# Patient Record
Sex: Male | Born: 2000 | Race: White | Hispanic: No | Marital: Single | Smoking: Never smoker
Health system: Southern US, Community
[De-identification: ages and names within clinical notes are randomized; demographics above are authoritative.]

## PROBLEM LIST (undated history)

## (undated) DIAGNOSIS — F988 Other specified behavioral and emotional disorders with onset usually occurring in childhood and adolescence: Secondary | ICD-10-CM

## (undated) HISTORY — PX: WISDOM TOOTH EXTRACTION: SHX21

---
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
## 2021-02-06 DIAGNOSIS — U071 COVID-19: Secondary | ICD-10-CM

## 2021-02-06 HISTORY — DX: COVID-19: U07.1

## 2021-02-08 ENCOUNTER — Other Ambulatory Visit: Payer: Self-pay

## 2021-02-08 ENCOUNTER — Emergency Department (INDEPENDENT_AMBULATORY_CARE_PROVIDER_SITE_OTHER)
Admission: RE | Admit: 2021-02-08 | Discharge: 2021-02-08 | Disposition: A | Discharge status: Home / Self Care | Payer: 59 | Source: Ambulatory Visit

## 2021-02-08 VITALS — BP 123/78 | HR 84 | Temp 98.7°F | Resp 16 | Ht 70.0 in | Wt 186.0 lb

## 2021-02-08 DIAGNOSIS — U071 COVID-19: Secondary | ICD-10-CM

## 2021-02-08 NOTE — ED Provider Notes (Signed)
[facility]    CSN: [id number] Arrival date & time: 02/08/21  1402      History   Chief Complaint Chief Complaint  Patient presents with   Covid Positive    HPI Mark Carey is a 20 y.o. male.   HPI 20 year old male presents with positive COVID-19 as of Thursday, 02/06/2021.  Patient works night shifts and needs a note to be excused from work.  Past Medical History:  Diagnosis Date   ADD (attention deficit disorder)    Asthma    COVID-19 02/06/2021    There are no problems to display for this patient.   Past Surgical History:  Procedure Laterality Date   WISDOM TOOTH EXTRACTION Bilateral        Home Medications    Prior to Admission medications   Medication Sig Start Date End Date Taking? Authorizing Provider  dexmethylphenidate (FOCALIN) 10 MG tablet Take 30 mg by mouth 2 (two) times daily. Patient not taking: Reported on 02/08/2021    [provider]  ibuprofen (ADVIL,MOTRIN) 100 MG/5ML suspension Take 15 mg/kg by mouth every 6 (six) hours as needed. Patient was given this medication for headache. Patient not taking: Reported on 02/08/2021    [provider]    Family History Family History  Problem Relation Age of Onset   Healthy Mother    Healthy Father     Social History Social History   Tobacco Use   Smoking status: Never    Passive exposure: Never   Smokeless tobacco: Never  Vaping Use   Vaping Use: Never used  Substance Use Topics   Alcohol use: Not Currently   Drug use: Not Currently     Allergies   Promethazine hcl   Review of Systems Review of Systems  Constitutional:  Positive for fatigue and fever.  All other systems reviewed and are negative.   Physical Exam Triage Vital Signs ED Triage Vitals  Enc Vitals Group     BP 02/08/21 1417 123/78     Pulse Rate 02/08/21 1417 84     Resp 02/08/21 1417 16     Temp 02/08/21 1417 98.7 F (37.1 C)     Temp Source 02/08/21 1417 Oral     SpO2  02/08/21 1417 99 %     Weight 02/08/21 1419 186 lb (84.4 kg)     Height 02/08/21 1419 5\' 10"  (1.778 m)     Head Circumference --      Peak Flow --      Pain Score 02/08/21 1419 0     Pain Loc --      Pain Edu? --      Excl. in GC? --    No data found.  Updated Vital Signs BP 123/78 (BP Location: Left Arm)   Pulse 84   Temp 98.7 F (37.1 C) (Oral)   Resp 16   Ht 5\' 10"  (1.778 m)   Wt 186 lb (84.4 kg)   SpO2 99%   BMI 26.69 kg/m      Physical Exam Vitals and nursing note reviewed.  Constitutional:      General: He is not in acute distress.    Appearance: Normal appearance. He is normal weight. He is not ill-appearing.  HENT:     Head: Normocephalic and atraumatic.     Right Ear: Tympanic membrane, ear canal and external ear normal.     Left Ear: Tympanic membrane, ear canal and external ear normal.     Mouth/Throat:  Mouth: Mucous membranes are moist.     Pharynx: Oropharynx is clear.  Eyes:     Extraocular Movements: Extraocular movements intact.     Conjunctiva/sclera: Conjunctivae normal.     Pupils: Pupils are equal, round, and reactive to light.  Cardiovascular:     Rate and Rhythm: Normal rate and regular rhythm.     Pulses: Normal pulses.     Heart sounds: Normal heart sounds.  Pulmonary:     Effort: Pulmonary effort is normal.     Breath sounds: Normal breath sounds.  Musculoskeletal:        General: Normal range of motion.     Cervical back: Normal range of motion and neck supple.  Skin:    General: Skin is warm and dry.  Neurological:     General: No focal deficit present.     Mental Status: He is alert and oriented to person, place, and time. Mental status is at baseline.     UC Treatments / Results  Labs (all labs ordered are listed, but only abnormal results are displayed) Labs Reviewed - No data to display  EKG   Radiology No results found.  Procedures Procedures (including critical care time)  Medications Ordered in  UC Medications - No data to display  Initial Impression / Assessment and Plan / UC Course  I have reviewed the triage vital signs and the nursing notes.  Pertinent labs & imaging results that were available during my care of the patient were reviewed by me and considered in my medical decision making (see chart for details).     MDM: 1.  COVID-19-Advised/instructed patient conservative measures for now may alternate between OTC Tylenol 1000 mg 1-2 times daily, as needed with OTC Ibuprofen 600 to 800 mg 1-2 times daily, as needed for either fever or myalgias.  Encouraged patient increase daily water intake.  Work note provided prior to discharge per patient request.  Patient discharged home, hemodynamically stable. Final Clinical Impressions(s) / UC Diagnoses   Final diagnoses:  COVID-19     Discharge Instructions      Advised/instructed patient conservative measures for now may alternate between OTC Tylenol 1000 mg 1-2 times daily, as needed with OTC Ibuprofen 600 to 800 mg 1-2 times daily, as needed for either fever or myalgias.  Encouraged patient increase daily water intake.  Work note provided prior to discharge per patient request.     ED Prescriptions   None    PDMP not reviewed this encounter.   Trevor Iha, FNP 02/08/21 1455

## 2021-02-08 NOTE — ED Triage Notes (Signed)
Home COVID test positive on Thursday  night  Symptoms started on Thursday am  Works night shift  Needs a work note  COVID vaccine x 2 - no booster  No OTC meds today

## 2021-02-08 NOTE — Discharge Instructions (Addendum)
Advised/instructed patient conservative measures for now may alternate between OTC Tylenol 1000 mg 1-2 times daily, as needed with OTC Ibuprofen 600 to 800 mg 1-2 times daily, as needed for either fever or myalgias.  Encouraged patient increase daily water intake.  Work note provided prior to discharge per patient request.

## 2022-01-06 DIAGNOSIS — H5213 Myopia, bilateral: Secondary | ICD-10-CM | POA: Diagnosis not present

## 2022-07-07 DIAGNOSIS — Z023 Encounter for examination for recruitment to armed forces: Secondary | ICD-10-CM | POA: Diagnosis not present

## 2022-07-16 ENCOUNTER — Ambulatory Visit: Payer: Commercial Managed Care - PPO | Admitting: Allergy and Immunology

## 2023-02-19 ENCOUNTER — Ambulatory Visit: Payer: Commercial Managed Care - PPO | Admitting: Family Medicine

## 2023-02-19 ENCOUNTER — Encounter: Payer: Self-pay | Admitting: Family Medicine

## 2023-02-19 VITALS — BP 112/70 | HR 87 | Ht 70.0 in | Wt 191.0 lb

## 2023-02-19 DIAGNOSIS — J069 Acute upper respiratory infection, unspecified: Secondary | ICD-10-CM | POA: Insufficient documentation

## 2023-02-19 DIAGNOSIS — E663 Overweight: Secondary | ICD-10-CM

## 2023-02-19 DIAGNOSIS — R748 Abnormal levels of other serum enzymes: Secondary | ICD-10-CM | POA: Diagnosis not present

## 2023-02-19 DIAGNOSIS — H6993 Unspecified Eustachian tube disorder, bilateral: Secondary | ICD-10-CM | POA: Diagnosis not present

## 2023-02-19 NOTE — Assessment & Plan Note (Signed)
Normal-appearing tympanic membranes.  No history of allergies.  Recommended Flonase daily

## 2023-02-19 NOTE — Assessment & Plan Note (Signed)
Was hospitalized for 11 days in August-September at a [facility] in [place].  He does not know what he had other than the fact that he could not speak above a whisper.  Uncertain why he had a liver ultrasound.  He is not sure why they told him he can eat potatoes but not rice. - Will try to get records from the [place]. - Will get CMP and CBC.  As well as A1c

## 2023-02-19 NOTE — Progress Notes (Signed)
New Patient Office Visit  Subjective    Patient ID: Mark Carey, male    DOB: January 06, 2001  Age: 22 y.o. MRN: [id number]  CC:  Chief Complaint  Patient presents with   New Patient (Initial Visit)    HPI Mark Carey presents to establish care No previous doctor.  Patient was previously in the [place] in [place].  Is from here originally.  Just recently moved back about a month ago.  Prior to coming back he was hospitalized for 11 days for a pneumonia/respiratory.  This was at the [facility].  Patient has recovered from this.  Patient unsure what he was diagnosed with but states he had a liver ultrasound done and was told "not to eat rice, but potatoes are okay".  PMH: none  PSH: wisdom teeth.    FH: none  Tobacco use: not tobacco.  Vapes nicotine Alcohol use: occasional 3/month.  Drug use: none Marital status: in a relationship.  Male.  No children.  Employment: was in the [employer].  Oct 3.  Works doing Scientist, physiological.   Sexual hx: active.      Outpatient Encounter Medications as of 02/19/2023  Medication Sig   [DISCONTINUED] dexmethylphenidate (FOCALIN) 10 MG tablet Take 30 mg by mouth 2 (two) times daily. (Patient not taking: Reported on 02/08/2021)   [DISCONTINUED] ibuprofen (ADVIL,MOTRIN) 100 MG/5ML suspension Take 15 mg/kg by mouth every 6 (six) hours as needed. Patient was given this medication for headache. (Patient not taking: Reported on 02/08/2021)   No facility-administered encounter medications on file as of 02/19/2023.    Past Medical History:  Diagnosis Date   ADD (attention deficit disorder)    Asthma    COVID-19 02/06/2021    Past Surgical History:  Procedure Laterality Date   WISDOM TOOTH EXTRACTION Bilateral     Family History  Problem Relation Age of Onset   Healthy Mother    Healthy Father     Social History   Socioeconomic History   Marital status: Single    Spouse name: Not on file   Number of children: Not on file   Years of  education: Not on file   Highest education level: Not on file  Occupational History   Not on file  Tobacco Use   Smoking status: Never    Passive exposure: Never   Smokeless tobacco: Never  Vaping Use   Vaping status: Never Used  Substance and Sexual Activity   Alcohol use: Not Currently   Drug use: Not Currently   Sexual activity: Not on file  Other Topics Concern   Not on file  Social History Narrative   Not on file   Social Determinants of Health   Financial Resource Strain: Not on file  Food Insecurity: Not on file  Transportation Needs: Not on file  Physical Activity: Not on file  Stress: Not on file  Social Connections: Not on file  Intimate Partner Violence: Not on file    ROS      Objective    BP 112/70   Pulse 87   Ht 5\' 10"  (1.778 m)   Wt 191 lb (86.6 kg)   SpO2 100%   BMI 27.41 kg/m   Physical Exam General: Alert and oriented HEENT: PERRLA, EOMI, moist mucosa.  Normal TM bilaterally CV: Regular in rhythm no murmurs Pulmonary: Lungs clear bilaterally GI: Soft, normal bowel sounds. MSK: Strength equal bilaterally, normal gait gait Extremities: No pedal edema Psych: Pleasant affect, spontaneous speech.  Assessment & Plan:   Abnormal liver enzymes -     CBC with Differential/Platelet -     Comprehensive metabolic panel  Overweight -     Lipid panel -     Hemoglobin A1c  Dysfunction of both eustachian tubes Assessment & Plan: Normal-appearing tympanic membranes.  No history of allergies.  Recommended Flonase daily   Recent upper respiratory tract infection Assessment & Plan: Was hospitalized for 11 days in August-September at a [facility] in [place].  He does not know what he had other than the fact that he could not speak above a whisper.  Uncertain why he had a liver ultrasound.  He is not sure why they told him he can eat potatoes but not rice. - Will try to get records from the [place]. - Will get CMP and CBC.  As well as  A1c     Return in about 1 year (around 02/19/2024) for physical.   Mark Kitty, MD

## 2023-02-19 NOTE — Progress Notes (Deleted)
   New Patient Office Visit  Subjective    Patient ID: Mark Carey, male    DOB: [birth year]  Age: 22 y.o. MRN: [id number]  CC: No chief complaint on file.   HPI Mark Carey presents to establish care ***  PMH: ***  PSH: ***  FH: ***  Tobacco use: *** Alcohol use: *** Drug use: *** Marital status: *** Employment: *** Sexual hx: ***  Screenings:  Colon Cancer: *** Lung Cancer: *** Breast Cancer: *** Diabetes: *** HLD: ***   Outpatient Encounter Medications as of 02/19/2023  Medication Sig   dexmethylphenidate (FOCALIN) 10 MG tablet Take 30 mg by mouth 2 (two) times daily. (Patient not taking: Reported on 02/08/2021)   ibuprofen (ADVIL,MOTRIN) 100 MG/5ML suspension Take 15 mg/kg by mouth every 6 (six) hours as needed. Patient was given this medication for headache. (Patient not taking: Reported on 02/08/2021)   No facility-administered encounter medications on file as of 02/19/2023.    Past Medical History:  Diagnosis Date   ADD (attention deficit disorder)    Asthma    COVID-19 02/06/2021    Past Surgical History:  Procedure Laterality Date   WISDOM TOOTH EXTRACTION Bilateral     Family History  Problem Relation Age of Onset   Healthy Mother    Healthy Father     Social History   Socioeconomic History   Marital status: Single    Spouse name: Not on file   Number of children: Not on file   Years of education: Not on file   Highest education level: Not on file  Occupational History   Not on file  Tobacco Use   Smoking status: Never    Passive exposure: Never   Smokeless tobacco: Never  Vaping Use   Vaping status: Never Used  Substance and Sexual Activity   Alcohol use: Not Currently   Drug use: Not Currently   Sexual activity: Not on file  Other Topics Concern   Not on file  Social History Narrative   Not on file   Social Determinants of Health   Financial Resource Strain: Not on file  Food Insecurity: Not on file   Transportation Needs: Not on file  Physical Activity: Not on file  Stress: Not on file  Social Connections: Not on file  Intimate Partner Violence: Not on file    ROS      Objective    There were no vitals taken for this visit.  Physical Exam     Assessment & Plan:   There are no diagnoses linked to this encounter.  No follow-ups on file.   Sandre Kitty, MD

## 2023-02-19 NOTE — Patient Instructions (Signed)
It was nice to see you today,  We addressed the following topics today: Your complaints are due to something called eustachian tube dysfunction.  The treatment for this is to use nasal corticosteroid such as fluticasone/Flonase.  These are over-the-counter and available at the grocery store or pharmacy. - Spray 2 sprays into each nostril once a day.  Do this every day for at least 2 weeks. - I will get some lab testing due to your recent illness and the questions surrounding that  Have a great day,  Frederic Jericho, MD

## 2023-02-20 LAB — CBC WITH DIFFERENTIAL/PLATELET
Basophils Absolute: 0 10*3/uL (ref 0.0–0.2)
Basos: 1 %
EOS (ABSOLUTE): 0.1 10*3/uL (ref 0.0–0.4)
Eos: 1 %
Hematocrit: 47.9 % (ref 37.5–51.0)
Hemoglobin: 15.2 g/dL (ref 13.0–17.7)
Immature Grans (Abs): 0 10*3/uL (ref 0.0–0.1)
Immature Granulocytes: 0 %
Lymphocytes Absolute: 1.6 10*3/uL (ref 0.7–3.1)
Lymphs: 21 %
MCH: 28.6 pg (ref 26.6–33.0)
MCHC: 31.7 g/dL (ref 31.5–35.7)
MCV: 90 fL (ref 79–97)
Monocytes Absolute: 0.9 10*3/uL (ref 0.1–0.9)
Monocytes: 11 %
Neutrophils Absolute: 4.9 10*3/uL (ref 1.4–7.0)
Neutrophils: 66 %
Platelets: 223 10*3/uL (ref 150–450)
RBC: 5.31 x10E6/uL (ref 4.14–5.80)
RDW: 14.8 % (ref 11.6–15.4)
WBC: 7.5 10*3/uL (ref 3.4–10.8)

## 2023-02-20 LAB — COMPREHENSIVE METABOLIC PANEL
ALT: 28 [IU]/L (ref 0–44)
AST: 25 [IU]/L (ref 0–40)
Albumin: 4.8 g/dL (ref 4.3–5.2)
Alkaline Phosphatase: 133 [IU]/L — ABNORMAL HIGH (ref 44–121)
BUN/Creatinine Ratio: 18 (ref 9–20)
BUN: 18 mg/dL (ref 6–20)
Bilirubin Total: 0.3 mg/dL (ref 0.0–1.2)
CO2: 25 mmol/L (ref 20–29)
Calcium: 9.7 mg/dL (ref 8.7–10.2)
Chloride: 107 mmol/L — ABNORMAL HIGH (ref 96–106)
Creatinine, Ser: 0.99 mg/dL (ref 0.76–1.27)
Globulin, Total: 2.3 g/dL (ref 1.5–4.5)
Glucose: 105 mg/dL — ABNORMAL HIGH (ref 70–99)
Potassium: 4.4 mmol/L (ref 3.5–5.2)
Sodium: 143 mmol/L (ref 134–144)
Total Protein: 7.1 g/dL (ref 6.0–8.5)
eGFR: 111 mL/min/{1.73_m2} (ref >59)

## 2023-02-20 LAB — LIPID PANEL
Chol/HDL Ratio: 2.9 ratio (ref 0.0–5.0)
Cholesterol, Total: 145 mg/dL (ref 100–199)
HDL: 50 mg/dL (ref >39)
LDL Chol Calc (NIH): 80 mg/dL (ref 0–99)
Triglycerides: 75 mg/dL (ref 0–149)
VLDL Cholesterol Cal: 15 mg/dL (ref 5–40)

## 2023-02-20 LAB — HEMOGLOBIN A1C
Est. average glucose Bld gHb Est-mCnc: 108 mg/dL
Hgb A1c MFr Bld: 5.4 % (ref 4.8–5.6)

## 2023-02-23 ENCOUNTER — Ambulatory Visit: Payer: Commercial Managed Care - PPO | Admitting: Family Medicine

## 2023-05-04 NOTE — Progress Notes (Deleted)
   Acute Office Visit  Subjective:     Patient ID: Mark Carey, male    DOB: [birth year], 23 y.o.   MRN: [id number]  No chief complaint on file.   HPI Patient is in today for ingrown toenail  Liver enzymes: alk phos was mildly elevated.   ROS      Objective:    There were no vitals taken for this visit. {Vitals History (Optional):23777}  Physical Exam  No results found for any visits on 05/05/23.      Assessment & Plan:   There are no diagnoses linked to this encounter.   No follow-ups on file.  Laneta Pintos, MD

## 2023-05-05 ENCOUNTER — Ambulatory Visit: Payer: Commercial Managed Care - PPO | Admitting: Family Medicine

## 2023-05-08 ENCOUNTER — Ambulatory Visit (HOSPITAL_BASED_OUTPATIENT_CLINIC_OR_DEPARTMENT_OTHER)
Admission: EM | Admit: 2023-05-08 | Discharge: 2023-05-08 | Disposition: A | Discharge status: Home / Self Care | Payer: Commercial Managed Care - PPO | Attending: Family Medicine | Admitting: Family Medicine

## 2023-05-08 ENCOUNTER — Encounter (HOSPITAL_BASED_OUTPATIENT_CLINIC_OR_DEPARTMENT_OTHER): Payer: Self-pay

## 2023-05-08 DIAGNOSIS — L729 Follicular cyst of the skin and subcutaneous tissue, unspecified: Secondary | ICD-10-CM

## 2023-05-08 NOTE — ED Triage Notes (Signed)
Patient presents with concerns regarding "cysts" that have been forming to front of forehead, back of head, and more recently right upper back. Reports first one appeared in July 2024. No drainage. States has tried to squeeze them with no discharge expelled. When asked if something has changed to cause him concern, patient states he's more concerned that more are popping up.

## 2023-05-08 NOTE — ED Provider Notes (Signed)
[facility]    CSN: [id number] Arrival date & time: 05/08/23  1101      History   Chief Complaint Chief Complaint  Patient presents with   possible cysts    HPI Mark Carey is a 23 y.o. male.   HPI Here for bumps that have shown up on his forehead and upper back near his neck.  The first 1 showed up in July 2024.  The one on his face has become a little red.  They have never gotten bigger or drained anything.  He did try to squeeze one of the lesions one time.  There was also 1 near his hairline on his posterior neck that has now resolved.  He feels a little pain there where that 1 has resolved  No fever any point.      Past Medical History:  Diagnosis Date   ADD (attention deficit disorder)    Asthma    COVID-19 02/06/2021    Patient Active Problem List   Diagnosis Date Noted   Recent upper respiratory tract infection 02/19/2023   Dysfunction of both eustachian tubes 02/19/2023    Past Surgical History:  Procedure Laterality Date   WISDOM TOOTH EXTRACTION Bilateral        Home Medications    Prior to Admission medications   Not on File    Family History Family History  Problem Relation Age of Onset   Healthy Mother    Healthy Father     Social History Social History   Tobacco Use   Smoking status: Never    Passive exposure: Never   Smokeless tobacco: Never  Vaping Use   Vaping status: Never Used  Substance Use Topics   Alcohol use: Not Currently   Drug use: Not Currently     Allergies   Promethazine hcl   Review of Systems Review of Systems   Physical Exam Triage Vital Signs ED Triage Vitals  Encounter Vitals Group     BP 05/08/23 1141 119/79     Systolic BP Percentile --      Diastolic BP Percentile --      Pulse Rate 05/08/23 1141 65     Resp 05/08/23 1141 20     Temp 05/08/23 1141 98.1 F (36.7 C)     Temp Source 05/08/23 1141 Oral     SpO2 05/08/23 1141 98 %     Weight 05/08/23 1144 197 lb (89.4 kg)      Height --      Head Circumference --      Peak Flow --      Pain Score 05/08/23 1143 3     Pain Loc --      Pain Education --      Exclude from Growth Chart --    No data found.  Updated Vital Signs BP 119/79 (BP Location: Right Arm)   Pulse 65   Temp 98.1 F (36.7 C) (Oral)   Resp 20   Wt 89.4 kg   SpO2 98%   BMI 28.27 kg/m   Visual Acuity Right Eye Distance:   Left Eye Distance:   Bilateral Distance:    Right Eye Near:   Left Eye Near:    Bilateral Near:     Physical Exam Vitals reviewed.  Constitutional:      General: He is not in acute distress.    Appearance: He is not ill-appearing, toxic-appearing or diaphoretic.  Skin:    Coloration: Skin is not pale.  Comments: On his left frontal area near the left eyebrow there is an area of soft tissue swelling that is about 1 cm in diameter.  There is some faint erythema overlying it.  There is no fluctuance.  It is fairly firm  There is a similar subcu nodule that is about 0.5 cm in diameter just belowhis his C7 to the right.  There is no overlying erythema.  It is maybe mildly tender there   I do not palpate any abnormality in his posterior hairline.  Neurological:     General: No focal deficit present.     Mental Status: He is alert and oriented to person, place, and time.  Psychiatric:        Behavior: Behavior normal.      UC Treatments / Results  Labs (all labs ordered are listed, but only abnormal results are displayed) Labs Reviewed - No data to display  EKG   Radiology No results found.  Procedures Procedures (including critical care time)  Medications Ordered in UC Medications - No data to display  Initial Impression / Assessment and Plan / UC Course  I have reviewed the triage vital signs and the nursing notes.  Pertinent labs & imaging results that were available during my care of the patient were reviewed by me and considered in my medical decision making (see chart for  details).    He is given contact information for dermatology.  I have asked him to follow-up with his primary care about this issue. Final Clinical Impressions(s) / UC Diagnoses   Final diagnoses:  Subcutaneous cyst     Discharge Instructions      Please follow-up with your primary care about this issue  Also, the Dr. Mayford Knife I have listed has an office here in [place].     ED Prescriptions   None    PDMP not reviewed this encounter.   Zenia Resides, MD 05/08/23 [phone]

## 2023-05-08 NOTE — Discharge Instructions (Addendum)
Please follow-up with your primary care about this issue  Also, the Dr. Mayford Knife I have listed has an office here in [place].

## 2023-07-30 ENCOUNTER — Emergency Department (HOSPITAL_BASED_OUTPATIENT_CLINIC_OR_DEPARTMENT_OTHER)
Admission: EM | Admit: 2023-07-30 | Discharge: 2023-07-31 | Disposition: A | Discharge status: Home / Self Care | Attending: Emergency Medicine | Admitting: Emergency Medicine

## 2023-07-30 ENCOUNTER — Other Ambulatory Visit: Payer: Self-pay

## 2023-07-30 ENCOUNTER — Encounter (HOSPITAL_BASED_OUTPATIENT_CLINIC_OR_DEPARTMENT_OTHER): Payer: Self-pay

## 2023-07-30 DIAGNOSIS — J40 Bronchitis, not specified as acute or chronic: Secondary | ICD-10-CM | POA: Insufficient documentation

## 2023-07-30 DIAGNOSIS — R0789 Other chest pain: Secondary | ICD-10-CM | POA: Diagnosis not present

## 2023-07-30 DIAGNOSIS — J45909 Unspecified asthma, uncomplicated: Secondary | ICD-10-CM | POA: Diagnosis not present

## 2023-07-30 DIAGNOSIS — R059 Cough, unspecified: Secondary | ICD-10-CM | POA: Diagnosis present

## 2023-07-30 NOTE — ED Triage Notes (Signed)
 Pt reports sore throat, headache, and productive cough x 3 days. Pt noticed blood tinged sputum tonight with cough. Pt also reports that he had a recent tick bite

## 2023-07-31 ENCOUNTER — Emergency Department (HOSPITAL_BASED_OUTPATIENT_CLINIC_OR_DEPARTMENT_OTHER): Admitting: Radiology

## 2023-07-31 ENCOUNTER — Emergency Department (HOSPITAL_BASED_OUTPATIENT_CLINIC_OR_DEPARTMENT_OTHER)

## 2023-07-31 DIAGNOSIS — R042 Hemoptysis: Secondary | ICD-10-CM | POA: Diagnosis not present

## 2023-07-31 LAB — CBC WITH DIFFERENTIAL/PLATELET
Abs Immature Granulocytes: 0.04 10*3/uL (ref 0.00–0.07)
Basophils Absolute: 0 10*3/uL (ref 0.0–0.1)
Basophils Relative: 0 %
Eosinophils Absolute: 0.1 10*3/uL (ref 0.0–0.5)
Eosinophils Relative: 1 %
HCT: 46.6 % (ref 39.0–52.0)
Hemoglobin: 16 g/dL (ref 13.0–17.0)
Immature Granulocytes: 0 %
Lymphocytes Relative: 14 %
Lymphs Abs: 1.4 10*3/uL (ref 0.7–4.0)
MCH: 29.6 pg (ref 26.0–34.0)
MCHC: 34.3 g/dL (ref 30.0–36.0)
MCV: 86.3 fL (ref 80.0–100.0)
Monocytes Absolute: 0.8 10*3/uL (ref 0.1–1.0)
Monocytes Relative: 7 %
Neutro Abs: 8.2 10*3/uL — ABNORMAL HIGH (ref 1.7–7.7)
Neutrophils Relative %: 78 %
Platelets: 170 10*3/uL (ref 150–400)
RBC: 5.4 MIL/uL (ref 4.22–5.81)
RDW: 13 % (ref 11.5–15.5)
WBC: 10.5 10*3/uL (ref 4.0–10.5)
nRBC: 0 % (ref 0.0–0.2)

## 2023-07-31 LAB — BASIC METABOLIC PANEL WITH GFR
Anion gap: 7 (ref 5–15)
BUN: 13 mg/dL (ref 6–20)
CO2: 27 mmol/L (ref 22–32)
Calcium: 9.3 mg/dL (ref 8.9–10.3)
Chloride: 103 mmol/L (ref 98–111)
Creatinine, Ser: 0.91 mg/dL (ref 0.61–1.24)
GFR, Estimated: >60 mL/min (ref >60)
Glucose, Bld: 112 mg/dL — ABNORMAL HIGH (ref 70–99)
Potassium: 4.3 mmol/L (ref 3.5–5.1)
Sodium: 137 mmol/L (ref 135–145)

## 2023-07-31 LAB — RESP PANEL BY RT-PCR (RSV, FLU A&B, COVID)  RVPGX2
Influenza A by PCR: NEGATIVE
Influenza B by PCR: NEGATIVE
Resp Syncytial Virus by PCR: NEGATIVE
SARS Coronavirus 2 by RT PCR: NEGATIVE

## 2023-07-31 LAB — D-DIMER, QUANTITATIVE: D-Dimer, Quant: 0.55 ug{FEU}/mL — ABNORMAL HIGH (ref 0.00–0.50)

## 2023-07-31 LAB — GROUP A STREP BY PCR: Group A Strep by PCR: NOT DETECTED

## 2023-07-31 MED ORDER — DOXYCYCLINE HYCLATE 100 MG PO CAPS: 100.0000 mg | ORAL_CAPSULE | Freq: Two times a day (BID) | ORAL | 0 refills | Status: AC

## 2023-07-31 MED ORDER — IOHEXOL 350 MG/ML SOLN
100.0000 mL | Freq: Once | INTRAVENOUS | Status: AC | PRN
  Administered 2023-07-31: 75 mL via INTRAVENOUS

## 2023-07-31 MED ORDER — BENZONATATE 100 MG PO CAPS: 100.0000 mg | ORAL_CAPSULE | Freq: Three times a day (TID) | ORAL | 0 refills | Status: AC

## 2023-07-31 NOTE — Discharge Instructions (Signed)
 Testing is negative for pneumonia or blood clot in the lung.  Take the antibiotic for a possible bronchitis infection.  follow-up with your doctor.  Return to the ED with difficulty breathing, chest pain, other concerns.

## 2023-07-31 NOTE — ED Provider Notes (Signed)
 [place] [facility]ENC[facility] DEPARTMENT AT [facility] Provider Note   CSN: [id number] Arrival date & time: 07/30/23  2347     History  Chief Complaint  Patient presents with   Cough    Mark Carey is a 23 y.o. male.  Patient with a history of asthma here with 3 days of productive cough of green and yellow mucus.  Today he noticed some blood streaks in his sputum.  Does have some left-sided chest pain worse with coughing.  It lasted about 45 seconds to 1 minute at a time.  Does not feel short of breath.  No fever.  No abdominal pain, nausea, vomiting, diarrhea.  No pain with urination or blood in the urine.  No travel or sick contacts.  Does not take any medications for his breathing normally.  Does not smoke.  Does have a sore throat as well.  No headache.  Some nasal congestion.  No fever.  Good p.o. intake and urine output  The history is provided by the patient.  Cough Associated symptoms: shortness of breath   Associated symptoms: no chest pain, no fever, no headaches, no myalgias, no rash and no rhinorrhea        Home Medications Prior to Admission medications   Not on File      Allergies    Promethazine hcl    Review of Systems   Review of Systems  Constitutional:  Negative for activity change, appetite change and fever.  HENT:  Negative for congestion and rhinorrhea.   Respiratory:  Positive for cough, chest tightness and shortness of breath.   Cardiovascular:  Negative for chest pain.  Gastrointestinal:  Negative for abdominal pain, nausea and vomiting.  Genitourinary:  Negative for dysuria and hematuria.  Musculoskeletal:  Negative for arthralgias and myalgias.  Skin:  Negative for rash.  Neurological:  Negative for dizziness, weakness and headaches.   all other systems are negative except as noted in the HPI and PMH.    Physical Exam Updated Vital Signs BP 137/84   Pulse (!) 102   Temp 99.5 F (37.5 C) (Oral)   Resp 20   Ht 5\' 11"  (1.803 m)    Wt 88.5 kg   SpO2 97%   BMI 27.20 kg/m  Physical Exam Vitals and nursing note reviewed.  Constitutional:      General: He is not in acute distress.    Appearance: He is well-developed.  HENT:     Head: Normocephalic and atraumatic.     Mouth/Throat:     Pharynx: No oropharyngeal exudate.  Eyes:     Conjunctiva/sclera: Conjunctivae normal.     Pupils: Pupils are equal, round, and reactive to light.  Neck:     Comments: No meningismus. Cardiovascular:     Rate and Rhythm: Regular rhythm. Tachycardia present.     Heart sounds: Normal heart sounds. No murmur heard. Pulmonary:     Effort: Pulmonary effort is normal. No respiratory distress.     Breath sounds: Normal breath sounds.  Chest:     Chest wall: Tenderness present.  Abdominal:     Palpations: Abdomen is soft.     Tenderness: There is no abdominal tenderness. There is no guarding or rebound.  Musculoskeletal:        General: No tenderness. Normal range of motion.     Cervical back: Normal range of motion and neck supple.  Skin:    General: Skin is warm.  Neurological:     Mental Status: He is  alert and oriented to person, place, and time.     Cranial Nerves: No cranial nerve deficit.     Motor: No abnormal muscle tone.     Coordination: Coordination normal.     Comments:  5/5 strength throughout. CN 2-12 intact.Equal grip strength.   Psychiatric:        Behavior: Behavior normal.     ED Results / Procedures / Treatments   Labs (all labs ordered are listed, but only abnormal results are displayed) Labs Reviewed  CBC WITH DIFFERENTIAL/PLATELET - Abnormal; Notable for the following components:      Result Value   Neutro Abs 8.2 (*)    All other components within normal limits  BASIC METABOLIC PANEL WITH GFR - Abnormal; Notable for the following components:   Glucose, Bld 112 (*)    All other components within normal limits  D-DIMER, QUANTITATIVE - Abnormal; Notable for the following components:   D-Dimer,  Quant 0.55 (*)    All other components within normal limits  GROUP A STREP BY PCR  RESP PANEL BY RT-PCR (RSV, FLU A&B, COVID)  [id number]    EKG EKG Interpretation Date/Time:  Saturday July 31 2023 00:34:41 EDT Ventricular Rate:  90 PR Interval:  127 QRS Duration:  116 QT Interval:  338 QTC Calculation: 414 R Axis:   78  Text Interpretation: Sinus rhythm Nonspecific intraventricular conduction delay No significant change was found Confirmed by Earma Gloss [phone]) on 07/31/2023 12:37:03 AM  Radiology CT Angio Chest PE W and/or Wo Contrast Result Date: 07/31/2023 CLINICAL DATA:  Pulmonary embolism (PE) suspected, low to intermediate prob, positive D-dimer. Hemoptysis EXAM: CT ANGIOGRAPHY CHEST WITH CONTRAST TECHNIQUE: Multidetector CT imaging of the chest was performed using the standard protocol during bolus administration of intravenous contrast. Multiplanar CT image reconstructions and MIPs were obtained to evaluate the vascular anatomy. RADIATION DOSE REDUCTION: This exam was performed according to the departmental dose-optimization program which includes automated exposure control, adjustment of the mA and/or kV according to patient size and/or use of iterative reconstruction technique. CONTRAST:  75mL OMNIPAQUE IOHEXOL 350 MG/ML SOLN COMPARISON:  Chest x-ray 07/31/2023 FINDINGS: Cardiovascular: Satisfactory opacification of the pulmonary arteries to the segmental level. No evidence of pulmonary embolism. Slightly limited evaluation of the subsegmental level. Normal heart size. No significant pericardial effusion. The thoracic aorta is normal in caliber. No atherosclerotic plaque of the thoracic aorta. No coronary artery calcifications. Mediastinum/Nodes: No enlarged mediastinal, hilar, or axillary lymph nodes. Thyroid gland, trachea, and esophagus demonstrate no significant findings. Lungs/Pleura: No focal consolidation. No pulmonary nodule. No pulmonary mass. No pleural effusion. No  pneumothorax. Upper Abdomen: No acute abnormality. Musculoskeletal: No chest wall abnormality. No suspicious lytic or blastic osseous lesions. No acute displaced fracture. Review of the MIP images confirms the above findings. IMPRESSION: 1. No pulmonary embolus. 2. No acute intrathoracic abnormality. Electronically Signed   By: Morgane  Naveau M.D.   On: 07/31/2023 01:32   DG Chest 2 View Result Date: 07/31/2023 CLINICAL DATA:  Hemoptysis EXAM: CHEST - 2 VIEW COMPARISON:  08/14/2015 FINDINGS: The heart size and mediastinal contours are within normal limits. Both lungs are clear. The visualized skeletal structures are unremarkable. No pneumothorax. IMPRESSION: Normal study. Electronically Signed   By: Janeece Mechanic M.D.   On: 07/31/2023 00:46    Procedures Procedures    Medications Ordered in ED Medications - No data to display  ED Course/ Medical Decision Making/ A&P  Medical Decision Making Amount and/or Complexity of Data Reviewed Labs: ordered. Decision-making details documented in ED Course. Radiology: ordered and independent interpretation performed. Decision-making details documented in ED Course. ECG/medicine tests: ordered and independent interpretation performed. Decision-making details documented in ED Course.  Risk Prescription drug management.   Cough x 3 days with blood streaks today.  No hypoxia or increased work of breathing.  Mildly tachycardic about 110.  No respiratory distress.  Clear lungs.  Will check x-ray, screening labs including D-dimer.  EKG is sinus rhythm without acute ischemia.  Chest x-ray is negative for infiltrate, results reviewed and interpreted by me.  Likely bronchitis but given his hemoptysis and tachycardia will screen with D-dimer.  D-dimer is positive.  Subsequent CT scan is negative for pulmonary embolism or pneumonia.  COVID and flu swabs are negative.  No hypoxia or increased work of breathing.  Clear  lungs.  Will treat for likely bronchitis with antibiotics and antitussives.  Low suspicion for ACS, PE, pneumothorax.  Follow-up with PCP.  Return to the ED with exertional chest pain, pain associate with shortness of breath, nausea, vomiting, sweating or other concerns        Final Clinical Impression(s) / ED Diagnoses Final diagnoses:  Bronchitis    Rx / DC Orders ED Discharge Orders     None         Tracie Dore, Mara Seminole, MD 07/31/23 0201

## 2023-08-16 ENCOUNTER — Encounter (HOSPITAL_BASED_OUTPATIENT_CLINIC_OR_DEPARTMENT_OTHER): Payer: Self-pay | Admitting: Emergency Medicine

## 2023-08-16 ENCOUNTER — Emergency Department (HOSPITAL_BASED_OUTPATIENT_CLINIC_OR_DEPARTMENT_OTHER)

## 2023-08-16 ENCOUNTER — Other Ambulatory Visit: Payer: Self-pay

## 2023-08-16 ENCOUNTER — Emergency Department (HOSPITAL_BASED_OUTPATIENT_CLINIC_OR_DEPARTMENT_OTHER)
Admission: EM | Admit: 2023-08-16 | Discharge: 2023-08-17 | Disposition: A | Discharge status: Home / Self Care | Attending: Emergency Medicine | Admitting: Emergency Medicine

## 2023-08-16 DIAGNOSIS — Z23 Encounter for immunization: Secondary | ICD-10-CM | POA: Insufficient documentation

## 2023-08-16 DIAGNOSIS — W270XXA Contact with workbench tool, initial encounter: Secondary | ICD-10-CM | POA: Diagnosis not present

## 2023-08-16 DIAGNOSIS — S61432A Puncture wound without foreign body of left hand, initial encounter: Secondary | ICD-10-CM | POA: Diagnosis not present

## 2023-08-16 DIAGNOSIS — S60922A Unspecified superficial injury of left hand, initial encounter: Secondary | ICD-10-CM | POA: Diagnosis present

## 2023-08-16 DIAGNOSIS — Z0489 Encounter for examination and observation for other specified reasons: Secondary | ICD-10-CM | POA: Diagnosis not present

## 2023-08-16 DIAGNOSIS — T148XXA Other injury of unspecified body region, initial encounter: Secondary | ICD-10-CM

## 2023-08-16 MED ORDER — CEPHALEXIN 500 MG PO CAPS: 500.0000 mg | ORAL_CAPSULE | Freq: Two times a day (BID) | ORAL | 0 refills | Status: AC

## 2023-08-16 MED ORDER — TETANUS-DIPHTH-ACELL PERTUSSIS 5-2.5-18.5 LF-MCG/0.5 IM SUSY
0.5000 mL | PREFILLED_SYRINGE | Freq: Once | INTRAMUSCULAR | Status: AC
Start: 2023-08-16 — End: 2023-08-16
  Administered 2023-08-16: 0.5 mL via INTRAMUSCULAR
  Filled 2023-08-16: qty 0.5

## 2023-08-16 NOTE — ED Provider Notes (Signed)
 [place] [facility]ERGENCY DEPARTMENT AT [facility] Provider Note   CSN: [id number] Arrival date & time: 08/16/23  2254     History  Chief Complaint  Patient presents with   Hand Injury    Mark Carey is a 23 y.o. male presents today with a left hand injury.  Patient reports accidentally puncturing his hand with a "screwdriver" this morning.  Patient endorses pain with movement.  Patient denies numbness, tingling, fever, chills, any other injury at this time.  Patient is unsure when his last tetanus booster was.   Hand Injury      Home Medications Prior to Admission medications   Medication Sig Start Date End Date Taking? Authorizing Provider  cephALEXin (KEFLEX) 500 MG capsule Take 1 capsule (500 mg total) by mouth 2 (two) times daily. 08/16/23  Yes Yanique Mulvihill N, PA-C  benzonatate  (TESSALON ) 100 MG capsule Take 1 capsule (100 mg total) by mouth every 8 (eight) hours. 07/31/23   Rancour, Mara Seminole, MD  doxycycline  (VIBRAMYCIN ) 100 MG capsule Take 1 capsule (100 mg total) by mouth 2 (two) times daily. 07/31/23   Earma Gloss, MD      Allergies    Promethazine hcl    Review of Systems   Review of Systems  Skin:  Positive for wound.    Physical Exam Updated Vital Signs BP 136/83 (BP Location: Left Arm)   Pulse 85   Temp 97.8 F (36.6 C)   Resp 18   SpO2 100%  Physical Exam Vitals and nursing note reviewed.  Constitutional:      General: He is not in acute distress.    Appearance: Normal appearance. He is well-developed. He is not ill-appearing, toxic-appearing or diaphoretic.  HENT:     Head: Normocephalic and atraumatic.     Nose: Nose normal.     Mouth/Throat:     Mouth: Mucous membranes are moist.  Eyes:     Extraocular Movements: Extraocular movements intact.     Conjunctiva/sclera: Conjunctivae normal.  Cardiovascular:     Rate and Rhythm: Normal rate.     Pulses: Normal pulses.  Pulmonary:     Effort: Pulmonary effort is normal. No  respiratory distress.  Abdominal:     Palpations: Abdomen is soft.  Musculoskeletal:        General: No swelling.     Cervical back: Neck supple.  Skin:    General: Skin is warm and dry.     Capillary Refill: Capillary refill takes less than 2 seconds.     Comments: Small hemostatic puncture wound noted to the patient's left radial edge of his hand between his thumb and second finger.  Wound is hemostatic on exam.  Patient is able to move his hand through full ROM with mild pain.  Patient is neurovascularly intact.  Patient has +2 radial pulses bilaterally.  Neurological:     General: No focal deficit present.     Mental Status: He is alert.  Psychiatric:        Mood and Affect: Mood normal.     ED Results / Procedures / Treatments   Labs (all labs ordered are listed, but only abnormal results are displayed) Labs Reviewed - No data to display  EKG None  Radiology No results found.  Procedures Procedures    Medications Ordered in ED Medications  Tdap (BOOSTRIX) injection 0.5 mL (0.5 mLs Intramuscular Given 08/16/23 2335)    ED Course/ Medical Decision Making/ A&P  Medical Decision Making  This patient presents to the ED for concern of hand wound differential diagnosis includes open fracture, carpal injury, phalanx injury, radial injury, ulnar injury, puncture wound, laceration   Imaging Studies ordered:  I ordered imaging studies including left hand x-ray I independently visualized and interpreted imaging which showed no radiopaque foreign bodies or fractures noted. I agree with the radiologist interpretation   Medicines ordered and prescription drug management:  I ordered medication including Keflex for infection prophylaxis Reevaluation of the patient after these medicines showed that the patient stayed the same I have reviewed the patients home medicines and have made adjustments as needed  Consider for admission or further  workup however patient's vital signs, physical exam, and imaging have been reassuring.  Patient has been given a course of Keflex to prevent wound infection.  Patient's Tdap was also updated while in the ED.  Patient given return precautions.  I feel patient is safe for discharge at this time.         Final Clinical Impression(s) / ED Diagnoses Final diagnoses:  Puncture wound    Rx / DC Orders ED Discharge Orders          Ordered    cephALEXin (KEFLEX) 500 MG capsule  2 times daily        08/16/23 2336              Carie Charity, PA-C 08/16/23 4098    Orvilla Blander, MD 08/17/23 [phone]

## 2023-08-16 NOTE — Discharge Instructions (Addendum)
 Your x-rays show no evidence for fracture or foreign body.  Today you were seen for a puncture wound to your left hand.  Please pick up your antibiotics and take as prescribed.  Please return to the ED if you have signs of infection including fever, uncontrollable vomiting, puslike discharge from the wound, or red streaking from the wound.  Thank you for letting us  treat you today. After reviewing your imaging, I feel you are safe to go home. Please follow up with your PCP in the next several days and provide them with your records from this visit. Return to the Emergency Room if pain becomes severe or symptoms worsen.

## 2023-08-16 NOTE — ED Triage Notes (Signed)
 Patient presents with left hand injury. Reports accidentally puncturing left hand with "screw driver" this morning. Bleeding controlled at present

## 2023-08-20 ENCOUNTER — Telehealth: Payer: Self-pay

## 2023-08-20 NOTE — Telephone Encounter (Signed)
 Copied from CRM [id number]. Topic: Appointments - Scheduling Inquiry for Clinic >> Aug 20, 2023 10:14 AM Opal Bill wrote: Reason for CRM: Pt needs an acute visit for today. A screwdriver went through is left hand and he is still unable to lift anything or stretch his hand out. Mom is asking if anyone cancel today, can they be contacted. Aware office closes at 12.

## 2023-08-20 NOTE — Telephone Encounter (Signed)
 Called pt to advise Dr. Arabella Beach recommended urgent care that would have the capability to xray the hand.

## 2024-02-21 ENCOUNTER — Encounter: Payer: Commercial Managed Care - PPO | Admitting: Family Medicine

## 2024-02-29 ENCOUNTER — Encounter: Admitting: Family Medicine
